# Patient Record
Sex: Female | Born: 1985 | Race: White | Hispanic: No | Marital: Single
Health system: Southern US, Community
[De-identification: ages and names within clinical notes are randomized; demographics above are authoritative.]

## PROBLEM LIST (undated history)

## (undated) DIAGNOSIS — G43909 Migraine, unspecified, not intractable, without status migrainosus: Secondary | ICD-10-CM

## (undated) DIAGNOSIS — G7111 Myotonic muscular dystrophy: Secondary | ICD-10-CM

## (undated) DIAGNOSIS — J45909 Unspecified asthma, uncomplicated: Secondary | ICD-10-CM

## (undated) DIAGNOSIS — F419 Anxiety disorder, unspecified: Secondary | ICD-10-CM

## (undated) HISTORY — DX: Migraine, unspecified, not intractable, without status migrainosus: G43.909

## (undated) HISTORY — PX: INNER EAR SURGERY: SHX679

## (undated) HISTORY — PX: TUBAL LIGATION: SHX77

## (undated) HISTORY — PX: OTHER SURGICAL HISTORY: SHX169

## (undated) HISTORY — DX: Anxiety disorder, unspecified: F41.9

## (undated) HISTORY — PX: CHOLECYSTECTOMY: SHX55

---

## 2019-05-23 ENCOUNTER — Ambulatory Visit: Payer: Medicaid Other | Admitting: Allergy and Immunology

## 2019-05-24 ENCOUNTER — Other Ambulatory Visit: Payer: Self-pay

## 2019-05-24 ENCOUNTER — Emergency Department (HOSPITAL_COMMUNITY): Payer: Medicaid Other

## 2019-05-24 ENCOUNTER — Emergency Department (HOSPITAL_COMMUNITY)
Admission: EM | Admit: 2019-05-24 | Discharge: 2019-05-24 | Disposition: A | Discharge status: Home / Self Care | Payer: Medicaid Other | Attending: Emergency Medicine | Admitting: Emergency Medicine

## 2019-05-24 DIAGNOSIS — Z5321 Procedure and treatment not carried out due to patient leaving prior to being seen by health care provider: Secondary | ICD-10-CM | POA: Diagnosis not present

## 2019-05-24 DIAGNOSIS — M79671 Pain in right foot: Secondary | ICD-10-CM | POA: Diagnosis present

## 2019-05-24 NOTE — ED Notes (Signed)
No Answer for Room 1X

## 2019-05-24 NOTE — ED Triage Notes (Signed)
Pt endorses tripping accidentally and falling down 2 stairs injuring her distal right foot. Pedal pulse present. VSS

## 2019-06-20 ENCOUNTER — Ambulatory Visit: Payer: Medicaid Other | Admitting: Allergy and Immunology

## 2019-06-22 ENCOUNTER — Encounter (HOSPITAL_COMMUNITY): Payer: Self-pay | Admitting: Emergency Medicine

## 2019-06-22 ENCOUNTER — Emergency Department (HOSPITAL_COMMUNITY): Payer: Medicaid Other

## 2019-06-22 ENCOUNTER — Other Ambulatory Visit: Payer: Self-pay

## 2019-06-22 ENCOUNTER — Emergency Department (HOSPITAL_COMMUNITY)
Admission: EM | Admit: 2019-06-22 | Discharge: 2019-06-22 | Disposition: A | Discharge status: Home / Self Care | Payer: Medicaid Other | Attending: Emergency Medicine | Admitting: Emergency Medicine

## 2019-06-22 DIAGNOSIS — F410 Panic disorder [episodic paroxysmal anxiety] without agoraphobia: Secondary | ICD-10-CM | POA: Diagnosis not present

## 2019-06-22 DIAGNOSIS — R42 Dizziness and giddiness: Secondary | ICD-10-CM | POA: Insufficient documentation

## 2019-06-22 DIAGNOSIS — R0602 Shortness of breath: Secondary | ICD-10-CM | POA: Insufficient documentation

## 2019-06-22 DIAGNOSIS — Z8709 Personal history of other diseases of the respiratory system: Secondary | ICD-10-CM | POA: Insufficient documentation

## 2019-06-22 DIAGNOSIS — G7100 Muscular dystrophy, unspecified: Secondary | ICD-10-CM | POA: Diagnosis not present

## 2019-06-22 DIAGNOSIS — R519 Headache, unspecified: Secondary | ICD-10-CM | POA: Diagnosis not present

## 2019-06-22 DIAGNOSIS — R0789 Other chest pain: Secondary | ICD-10-CM | POA: Diagnosis present

## 2019-06-22 HISTORY — DX: Unspecified asthma, uncomplicated: J45.909

## 2019-06-22 HISTORY — DX: Myotonic muscular dystrophy: G71.11

## 2019-06-22 LAB — CBC
HCT: 43.8 % (ref 36.0–46.0)
Hemoglobin: 13.8 g/dL (ref 12.0–15.0)
MCH: 30.7 pg (ref 26.0–34.0)
MCHC: 31.5 g/dL (ref 30.0–36.0)
MCV: 97.6 fL (ref 80.0–100.0)
Platelets: 283 10*3/uL (ref 150–400)
RBC: 4.49 MIL/uL (ref 3.87–5.11)
RDW: 12.5 % (ref 11.5–15.5)
WBC: 7 10*3/uL (ref 4.0–10.5)
nRBC: 0 % (ref 0.0–0.2)

## 2019-06-22 LAB — BASIC METABOLIC PANEL
Anion gap: 12 (ref 5–15)
BUN: 5 mg/dL — ABNORMAL LOW (ref 6–20)
CO2: 18 mmol/L — ABNORMAL LOW (ref 22–32)
Calcium: 9.3 mg/dL (ref 8.9–10.3)
Chloride: 109 mmol/L (ref 98–111)
Creatinine, Ser: 0.77 mg/dL (ref 0.44–1.00)
GFR calc Af Amer: >60 mL/min (ref >60)
GFR calc non Af Amer: >60 mL/min (ref >60)
Glucose, Bld: 115 mg/dL — ABNORMAL HIGH (ref 70–99)
Potassium: 3.4 mmol/L — ABNORMAL LOW (ref 3.5–5.1)
Sodium: 139 mmol/L (ref 135–145)

## 2019-06-22 LAB — I-STAT BETA HCG BLOOD, ED (MC, WL, AP ONLY): I-stat hCG, quantitative: <5 m[IU]/mL (ref <5)

## 2019-06-22 LAB — TROPONIN I (HIGH SENSITIVITY): Troponin I (High Sensitivity): 4 ng/L (ref <18)

## 2019-06-22 MED ORDER — SODIUM CHLORIDE 0.9% FLUSH: 3.0000 mL | Freq: Once | INTRAVENOUS | Status: DC

## 2019-06-22 NOTE — Discharge Instructions (Signed)
You were seen today for chest pain. Your labs, imaging, and EKG did not show evidence that this pain is coming from your heart. You also did not show evidence of infection or blood clot. You should follow up with your primary doctor soon to discuss your anxiety further and potentially start medications or see a counselor. If you develop worsened shortness of breath, chest pain, or nausea, vomiting, or fever, come back to the ED to be seen.

## 2019-06-22 NOTE — ED Triage Notes (Addendum)
C/o pain to center of chest, SOB, and tingling to bilateral hands that started approx 30 min ago.  Pt hyperventilating on arrival.  States she was upset about something prior to arrival.  Encouraged pt to slow breathing down.

## 2019-06-22 NOTE — ED Notes (Signed)
Patient verbalizes understanding of discharge instructions. Opportunity for questioning and answers were provided. Armband removed by staff, pt discharged from ED.  

## 2019-06-22 NOTE — ED Provider Notes (Signed)
[facility] EMERGENCY DEPARTMENT Provider Note   CSN: [id number] Arrival date & time: 06/22/19  1824     History   Chief Complaint Chief Complaint  Patient presents with  . Chest Pain  . Shaking    HPI Leslie Mcintyre is a 33 y.o. 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 Pain is described as sharp, shooting and radiates to her shoulder. She has some pain with deep breathing. She has not taken any medication for this. She feels she has been more short of breath today but has not used her inhaler. States nothing makes pain better or worse.  States she has been under a lot of stress recently as she had an aunt and uncle recently pass away as well as her dog and cat. She had a similar episode last year and was told it was anxiety related. She is not on any medications for anxiety nor does she see a Social worker. She is seen in a Muscular Dystrophy clinic where she gets EKGs every 2 years but has not been seen there in about 4 years. Takes OCPs. She is a never smoker. No FH of early cardiac disease. Denies N/V, fever, cough, congestion, leg swelling.     Past Medical History:  Diagnosis Date  . Asthma   . 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   Past Surgical History:  Procedure Laterality Date  . CHOLECYSTECTOMY    . INNER EAR SURGERY    . TUBAL LIGATION       OB History   No obstetric history on file.      Home Medications    Prior to Admission medications   Not on File    Family History No family history on file.  Social History Social History   Tobacco Use  . Smoking status: Never Smoker  . Smokeless tobacco: Never Used  Substance Use Topics   . Alcohol use: Not Currently  . Drug use: Never     Allergies   Patient has no allergy information on record.   Review of Systems Review of Systems  Constitutional: Negative for fever.  HENT: Negative for congestion, rhinorrhea and sore throat.   Respiratory: Positive for shortness of breath. Negative for cough.   Cardiovascular: Positive for chest pain. Negative for leg swelling.  Gastrointestinal: Negative for nausea and vomiting.  Genitourinary: Negative for difficulty urinating.  Skin: Negative for rash.  Neurological: Positive for dizziness and headaches.    Physical Exam Updated Vital Signs BP 96/69   Pulse 75   Temp 99.1 F (37.3 C) (Oral)   Resp (!) 22   SpO2 99%   Physical Exam Constitutional:      General: She is not in acute distress.    Appearance: She is well-developed. She is not ill-appearing, toxic-appearing or diaphoretic.  HENT:     Head: Normocephalic.  Cardiovascular:     Rate and Rhythm: Normal rate and regular rhythm.     Heart sounds: Normal heart sounds. No murmur.  Pulmonary:     Effort: Pulmonary effort is normal. No tachypnea, accessory muscle usage or respiratory distress.     Breath sounds: Normal breath sounds. No wheezing or rales.  Chest:  Chest wall: Tenderness present.  Abdominal:     General: Bowel sounds are normal.     Palpations: Abdomen is soft.     Tenderness: There is no abdominal tenderness.  Musculoskeletal:     Right lower leg: She exhibits no tenderness. No edema.     Left lower leg: She exhibits no tenderness. No edema.  Skin:    General: Skin is warm and dry.     Findings: No erythema.  Neurological:     Mental Status: She is alert.  Psychiatric:        Mood and Affect: Mood is anxious.     ED Treatments / Results  Labs (all labs ordered are listed, but only abnormal results are displayed) Labs Reviewed  BASIC METABOLIC PANEL - Abnormal; Notable for the following components:      Result Value    Potassium 3.4 (*)    CO2 18 (*)    Glucose, Bld 115 (*)    BUN 5 (*)    All other components within normal limits  CBC  I-STAT BETA HCG BLOOD, ED ([facility], [facility], [facility] ONLY)  TROPONIN I (HIGH SENSITIVITY)  TROPONIN I (HIGH SENSITIVITY)    EKG EKG Interpretation  Date/Time:  Saturday June 22 2019 18:38:43 EST Ventricular Rate:  101 PR Interval:  138 QRS Duration: 70 QT Interval:  346 QTC Calculation: 448 R Axis:   74 Text Interpretation: Sinus tachycardia Nonspecific T wave abnormality Abnormal ECG Poor baseline Confirmed by Blane OharaZavitz, Joshua [phone]([id number]) on 06/22/2019 8:08:38 PM   Radiology Dg Chest 2 View  Result Date: 06/22/2019 CLINICAL DATA:  C/o pain to center of chest, SOB, and tingling to bilateral hands that started approx 30 min ago. Pt hyperventilating on arrival. States she was upset about something prior to arrival. Encouraged pt to slow breathing down. EXAM: CHEST - 2 VIEW COMPARISON:  05/11/2018. FINDINGS: The heart size and mediastinal contours are within normal limits. Both lungs are clear. No pleural effusion or pneumothorax. The visualized skeletal structures are unremarkable. IMPRESSION: No active cardiopulmonary disease. Electronically Signed   By: Amie Portlandavid  Ormond M.D.   On: 06/22/2019 20:17    Procedures Procedures (including critical care time)  Medications Ordered in ED Medications  sodium chloride flush (NS) 0.9 % injection 3 mL (has no administration in time range)    Initial Impression / Assessment and Plan / ED Course  I have reviewed the triage vital signs and the nursing notes.  Pertinent labs & imaging results that were available during my care of the patient were reviewed by me and considered in my medical decision making (see chart for details).   33yo F w/ h/o asthma, myotonic muscular dystrophy who presents with chest pain and tingling of extremities immediately after getting into verbal argument with boyfriend earlier today while visiting father in the  hospital. Vitals notable for tachycardia and tachypnea initially but normalized by the time of my exam. Appropriately saturated on room air without increased work of breathing. On exam, cardiac and lung exam wnl and without wheezing or rales. CXR clear with no evidence of pneumonia. Electrolytes wnl. Chest pain reproducible with palpation of chest wall. Considered PE given OCP use however has not had LE swelling, redness, pain or any recent long distance travel or immobility. Also with HR and RR wnl on exam, low suspicion for PE. Initial EKG with wandering baseline, repeat EKG without ischemic changes or interval or T wave abnormalities. Troponin negative. Given several recent stressors and normal workup, most likely  2/2 anxiety, especially given similar episode in the past. Recommended patient follow up with PCP and/or counselor. Instructed patient to return to ED for worsened chest pain, shortness of breath or develops N/V, fevers. Patient verbalized understanding.    Final Clinical Impressions(s) / ED Diagnoses   Final diagnoses:  Panic attack    ED Discharge Orders    None       Ellwood Dense, DO 06/22/19 2049    Blane Ohara, MD 06/22/19 [phone]

## 2019-07-11 ENCOUNTER — Other Ambulatory Visit: Payer: Self-pay

## 2019-07-11 ENCOUNTER — Ambulatory Visit (INDEPENDENT_AMBULATORY_CARE_PROVIDER_SITE_OTHER): Payer: Medicaid Other | Admitting: Allergy and Immunology

## 2019-07-11 ENCOUNTER — Encounter: Payer: Self-pay | Admitting: Allergy and Immunology

## 2019-07-11 VITALS — BP 104/60 | HR 99 | Temp 98.5°F | Resp 16 | Ht 63.0 in | Wt 110.9 lb

## 2019-07-11 DIAGNOSIS — J3089 Other allergic rhinitis: Secondary | ICD-10-CM | POA: Diagnosis not present

## 2019-07-11 DIAGNOSIS — H1013 Acute atopic conjunctivitis, bilateral: Secondary | ICD-10-CM

## 2019-07-11 DIAGNOSIS — H1010 Acute atopic conjunctivitis, unspecified eye: Secondary | ICD-10-CM | POA: Insufficient documentation

## 2019-07-11 DIAGNOSIS — J4540 Moderate persistent asthma, uncomplicated: Secondary | ICD-10-CM | POA: Insufficient documentation

## 2019-07-11 DIAGNOSIS — J329 Chronic sinusitis, unspecified: Secondary | ICD-10-CM | POA: Diagnosis not present

## 2019-07-11 MED ORDER — AZELASTINE & FLUTICASONE 137 & 50 MCG/ACT NA THPK: 1.0000 | PACK | Freq: Two times a day (BID) | NASAL | 5 refills | Status: AC

## 2019-07-11 MED ORDER — PAZEO 0.7 % OP SOLN: 1.0000 [drp] | OPHTHALMIC | 5 refills | Status: AC

## 2019-07-11 MED ORDER — BUDESONIDE-FORMOTEROL FUMARATE 160-4.5 MCG/ACT IN AERO: INHALATION_SPRAY | RESPIRATORY_TRACT | 5 refills | Status: AC

## 2019-07-11 MED ORDER — ALBUTEROL SULFATE HFA 108 (90 BASE) MCG/ACT IN AERS: 2.0000 | INHALATION_SPRAY | RESPIRATORY_TRACT | 1 refills | Status: AC | PRN

## 2019-07-11 NOTE — Progress Notes (Signed)
New Patient Note  RE: Leslie Mcintyre Prevo MRN: [id number] DOB: 09-10-1985 Date of Office Visit: 07/11/2019  Referring provider: Earnest RosierMurphy, John B, MD Primary care provider: Earnest RosierMurphy, John B, MD  Chief Complaint: Asthma, Sinusitis, and Sore Throat   History of present illness: Leslie Mcintyre Vey is a 33 y.o. female seen today in consultation requested by Theophilus KindsJohn Murphy, MD.  She reports that over the past year and a half she has had recurrent infections.  She states that she has been on multiple rounds of antibiotics and systemic steroids for pharyngitis.  In addition, she reports that she has had 7 or 8 sinus infections over this past year as well as 4 episodes of bilateral otitis media.  She notes that she has not had ear infections prior to this past year and she has had more frequent sinus infections over this past year as well.  She has had recurrent urinary tract infection since she was 33 years old.  She has no known history of pneumonia. She experiences nasal congestion, rhinorrhea, postnasal drainage, and sinus pressure.  No significant seasonal symptom variation has been noted nor have specific environmental triggers been identified.  She currently takes montelukast daily and uses fluticasone nasal spray at the onset of sinus infections.  She is able to smell and taste. Griffin has had asthma symptoms since early childhood.  She reports that her asthma symptoms have become more frequent this year she is currently using Symbicort 160-4.5 g as needed.  She does not use a spacer device with her HFA inhalers.  Assessment and plan: Recurrent infections Immunocompetence will be assessed with labs.  The following labs have been ordered: CBC with differential, IgG, IgA and IgM, tetanus IgG titers, and pneumococcal IgG titers.  If pre-vaccination titers are low, post-vaccination titers will be drawn to assess response.    The patient will be called with further recommendations and follow-up instructions  once the labs have returned.  Perennial allergic rhinitis with a probable nonallergic component  Aeroallergen avoidance measures have been discussed and provided in written form.  A prescription has been provided for azelastine/fluticasone nasal spray, 1 spray per nostril twice daily as needed. Proper nasal spray technique has been discussed and demonstrated.  Nasal saline lavage (NeilMed) has been recommended as needed and prior to medicated nasal sprays along with instructions for proper administration.  For thick post nasal drainage, add guaifenesin 1200 mg (Mucinex Maximum Strength)  twice daily as needed with adequate hydration as discussed.  Moderate persistent asthma  For now, continue Symbicort 160-4.5 g, 2 inhalations twice daily.  To maximize pulmonary deposition, a spacer has been provided along with instructions for its proper administration with an HFA inhaler.  Continue albuterol HFA, 1 to 2 inhalations every 4-6 hours if needed.  Subjective and objective measures of pulmonary function will be followed and the treatment plan will be adjusted accordingly.  Allergic conjunctivitis  Treatment plan as outlined above for allergic rhinitis.  A prescription has been provided for Pazeo, one drop per eye daily as needed.  I have also recommended eye lubricant drops (i.e., Natural Tears) as needed.   Meds ordered this encounter  Medications  . Olopatadine HCl (PAZEO) 0.7 % SOLN    Sig: Place 1 drop into both eyes 1 day or 1 dose.    Dispense:  2.5 mL    Refill:  5  . budesonide-formoterol (SYMBICORT) 160-4.5 MCG/ACT inhaler    Sig: 2 puffs twice daily to prevent coughing or wheezing.  Dispense:  1 Inhaler    Refill:  5  . albuterol (PROAIR HFA) 108 (90 Base) MCG/ACT inhaler    Sig: Inhale 2 puffs into the lungs every 4 (four) hours as needed for wheezing or shortness of breath.    Dispense:  18 g    Refill:  1  . Azelastine & Fluticasone 137 & 50 MCG/ACT THPK     Sig: Place 1 spray into the nose 2 (two) times daily.    Dispense:  1 each    Refill:  5    Diagnostics: Spirometry: Spirometry reveals an FVC of 2.84 L and an FEV1 of 2.47 L (80% predicted) without postbronchodilator improvement.  Please see scanned spirometry results for details. Epicutaneous testing: Negative despite a positive histamine control. Intradermal testing: Positive to molds.  Physical examination: Blood pressure 104/60, pulse 99, temperature 98.5 F (36.9 C), temperature source Oral, resp. rate 16, height 5\' 3"  (1.6 m), weight 110 lb 14.3 oz (50.3 kg), SpO2 98 %.  General: Alert, interactive, in no acute distress. HEENT: TMs pearly gray, turbinates moderately edematous with clear discharge, post-pharynx moderately erythematous. Neck: Supple without lymphadenopathy. Lungs: Clear to auscultation without wheezing, rhonchi or rales. CV: Normal S1, S2 without murmurs. Abdomen: Nondistended, nontender. Skin: Warm and dry, without lesions or rashes. Extremities:  No clubbing, cyanosis or edema. Neuro:   Grossly intact.  Review of systems:  Review of systems negative except as noted in HPI / PMHx or noted below: Review of Systems  Constitutional: Negative.   HENT: Negative.   Eyes: Negative.   Respiratory: Negative.   Cardiovascular: Negative.   Gastrointestinal: Negative.   Genitourinary: Negative.   Musculoskeletal: Negative.   Skin: Negative.   Neurological: Negative.   Endo/Heme/Allergies: Negative.   Psychiatric/Behavioral: Negative.     Past medical history:  Past Medical History:  Diagnosis Date  . Anxiety   . Asthma   . Migraines   . Myotonic muscular dystrophy ([facility])     Past surgical history:  Past Surgical History:  Procedure Laterality Date  . balifiritis Bilateral    get the bumps removed  . CHOLECYSTECTOMY    . INNER EAR SURGERY    . TUBAL LIGATION      Family history: Family History  Problem Relation Age of Onset  . Allergic rhinitis  Mother   . Angioedema Neg Hx   . Asthma Neg Hx   . Eczema Neg Hx   . Immunodeficiency Neg Hx   . Urticaria Neg Hx     Social history: Social History   Socioeconomic History  . Marital status: Single    Spouse name: Not on file  . Number of children: Not on file  . Years of education: Not on file  . Highest education level: Not on file  Occupational History  . Not on file  Tobacco Use  . Smoking status: Passive Smoke Exposure - Never Smoker  . Smokeless tobacco: Never Used  Substance and Sexual Activity  . Alcohol use: Not Currently  . Drug use: Never  . Sexual activity: Not on file  Other Topics Concern  . Not on file  Social History Narrative  . Not on file   Social Determinants of Health   Financial Resource Strain:   . Difficulty of Paying Living Expenses: Not on file  Food Insecurity:   . Worried About in the Last Year: Not on file  . Ran Out of Food in the Last Year: Not on file  Transportation Needs:   . Film/video editor (Medical): Not on file  . Lack of Transportation (Non-Medical): Not on file  Physical Activity:   . Days of Exercise per Week: Not on file  . Minutes of Exercise per Session: Not on file  Stress:   . Feeling of Stress : Not on file  Social Connections:   . Frequency of Communication with Friends and Family: Not on file  . Frequency of Social Gatherings with Friends and Family: Not on file  . Attends Religious Services: Not on file  . Active Member of Clubs or Organizations: Not on file  . Attends Archivist Meetings: Not on file  . Marital Status: Not on file  Intimate Partner Violence:   . Fear of Current or Ex-Partner: Not on file  . Emotionally Abused: Not on file  . Physically Abused: Not on file  . Sexually Abused: Not on file    Environmental History: The patient lives in a house with carpeting throughout, central air, and window air conditioning units.  She currently has no pets in the home.   There is no known mold/water damage in the home.  She is a non-smoker.  Current Outpatient Medications  Medication Sig Dispense Refill  . albuterol (VENTOLIN HFA) 108 (90 Base) MCG/ACT inhaler INL 1 PUFF PO Q 4 H PRN    . budesonide-formoterol (SYMBICORT) 160-4.5 MCG/ACT inhaler     . cetirizine (ZYRTEC) 10 MG tablet Take 10 mg by mouth daily.    Marland Kitchen CIPRODEX OTIC suspension Place 4 drops into both ears as needed.     . montelukast (SINGULAIR) 10 MG tablet Take 10 mg by mouth as needed.     . neomycin-polymyxin-hydrocortisone (CORTISPORIN) 3.5-10000-1 OTIC suspension Place 4 drops into both ears as needed.     Marland Kitchen omeprazole (PRILOSEC) 20 MG capsule Take 20 mg by mouth daily.    . progesterone (PROMETRIUM) 200 MG capsule     . promethazine (PHENERGAN) 25 MG tablet Take 25 mg by mouth as needed.     Marland Kitchen albuterol (PROAIR HFA) 108 (90 Base) MCG/ACT inhaler Inhale 2 puffs into the lungs every 4 (four) hours as needed for wheezing or shortness of breath. 18 g 1  . Azelastine & Fluticasone 137 & 50 MCG/ACT THPK Place 1 spray into the nose 2 (two) times daily. 1 each 5  . budesonide-formoterol (SYMBICORT) 160-4.5 MCG/ACT inhaler 2 puffs twice daily to prevent coughing or wheezing. 1 Inhaler 5  . Olopatadine HCl (PAZEO) 0.7 % SOLN Place 1 drop into both eyes 1 day or 1 dose. 2.5 mL 5   No current facility-administered medications for this visit.    Known medication allergies: Allergies  Allergen Reactions  . Lac Bovis Other (See Comments)    Other reaction(s): Other Kidney infection Kidney infection Other reaction(s): Other Kidney infection Kidney infection   . Sumatriptan Other (See Comments)    Patient can not tolerate. Patient can not tolerate.   . Tramadol Itching and Rash    Blurry vision   . Triptans Other (See Comments)    Patient can not tolerate. Patient can not tolerate.     I appreciate the opportunity to take part in Jonet's care. Please do not hesitate to contact me  with questions.  Sincerely,   R. Edgar Frisk, MD

## 2019-07-11 NOTE — Patient Instructions (Addendum)
Recurrent infections Immunocompetence will be assessed with labs.  The following labs have been ordered: CBC with differential, IgG, IgA and IgM, tetanus IgG titers, and pneumococcal IgG titers.  If pre-vaccination titers are low, post-vaccination titers will be drawn to assess response.    The patient will be called with further recommendations and follow-up instructions once the labs have returned.  Perennial allergic rhinitis with a probable nonallergic component  Aeroallergen avoidance measures have been discussed and provided in written form.  A prescription has been provided for azelastine/fluticasone nasal spray, 1 spray per nostril twice daily as needed. Proper nasal spray technique has been discussed and demonstrated.  Nasal saline lavage (NeilMed) has been recommended as needed and prior to medicated nasal sprays along with instructions for proper administration.  For thick post nasal drainage, add guaifenesin 1200 mg (Mucinex Maximum Strength)  twice daily as needed with adequate hydration as discussed.  Moderate persistent asthma  For now, continue Symbicort 160-4.5 g, 2 inhalations twice daily.  To maximize pulmonary deposition, a spacer has been provided along with instructions for its proper administration with an HFA inhaler.  Continue albuterol HFA, 1 to 2 inhalations every 4-6 hours if needed.  Subjective and objective measures of pulmonary function will be followed and the treatment plan will be adjusted accordingly.  Allergic conjunctivitis  Treatment plan as outlined above for allergic rhinitis.  A prescription has been provided for Pazeo, one drop per eye daily as needed.  I have also recommended eye lubricant drops (i.e., Natural Tears) as needed.   When lab results have returned you will be called with further recommendations. With the newly implemented Cures Act, the labs may be visible to you at the same time they become visible to [place]. However, the  results will typically not be addressed until all of the results are back, so please be patient.  Until you have heard from [place], please continue the treatment plan as outlined on your take home sheet.  Control of Mold Allergen  Mold and fungi can grow on a variety of surfaces provided certain temperature and moisture conditions exist.  Outdoor molds grow on plants, decaying vegetation and soil.  The major outdoor mold, Alternaria and Cladosporium, are found in very high numbers during hot and dry conditions.  Generally, a late Summer - Fall peak is seen for common outdoor fungal spores.  Rain will temporarily lower outdoor mold spore count, but counts rise rapidly when the rainy period ends.  The most important indoor molds are Aspergillus and Penicillium.  Dark, humid and poorly ventilated basements are ideal sites for mold growth.  The next most common sites of mold growth are the bathroom and the kitchen.  Outdoor [employer] 1. Use air conditioning and keep windows closed 2. Avoid exposure to decaying vegetation. 3. Avoid leaf raking. 4. Avoid grain handling. 5. Consider wearing a face mask if working in moldy areas.  Indoor Mold Control 1. Maintain humidity below 50%. 2. Clean washable surfaces with 5% bleach solution. 3. Remove sources e.g. Contaminated carpets.

## 2019-07-11 NOTE — Assessment & Plan Note (Signed)
   Aeroallergen avoidance measures have been discussed and provided in written form.  A prescription has been provided for azelastine/fluticasone nasal spray, 1 spray per nostril twice daily as needed. Proper nasal spray technique has been discussed and demonstrated.  Nasal saline lavage (NeilMed) has been recommended as needed and prior to medicated nasal sprays along with instructions for proper administration.  For thick post nasal drainage, add guaifenesin 1200 mg (Mucinex Maximum Strength)  twice daily as needed with adequate hydration as discussed. 

## 2019-07-11 NOTE — Assessment & Plan Note (Signed)
   Treatment plan as outlined above for allergic rhinitis.  A prescription has been provided for Pazeo, one drop per eye daily as needed.  I have also recommended eye lubricant drops (i.e., Natural Tears) as needed. 

## 2019-07-11 NOTE — Assessment & Plan Note (Signed)
   Immunocompetence will be assessed with labs.  The following labs have been ordered: CBC with differential, IgG, IgA and IgM, tetanus IgG titers, and pneumococcal IgG titers.  If pre-vaccination titers are low, post-vaccination titers will be drawn to assess response.    The patient will be called with further recommendations and follow-up instructions once the labs have returned. 

## 2019-07-11 NOTE — Assessment & Plan Note (Signed)
   For now, continue Symbicort 160-4.5 g, 2 inhalations twice daily.  To maximize pulmonary deposition, a spacer has been provided along with instructions for its proper administration with an HFA inhaler.  Continue albuterol HFA, 1 to 2 inhalations every 4-6 hours if needed.  Subjective and objective measures of pulmonary function will be followed and the treatment plan will be adjusted accordingly.

## 2019-07-16 ENCOUNTER — Other Ambulatory Visit: Payer: Self-pay

## 2019-07-16 MED ORDER — AZELASTINE HCL 0.1 % NA SOLN: 2.0000 | Freq: Two times a day (BID) | NASAL | 5 refills | Status: AC

## 2019-07-16 MED ORDER — FLUTICASONE PROPIONATE 50 MCG/ACT NA SUSP: 2.0000 | Freq: Two times a day (BID) | NASAL | 5 refills | Status: AC

## 2019-07-22 LAB — STREP PNEUMONIAE 23 SEROTYPES IGG
Pneumo Ab Type 1*: 1.5 ug/mL (ref >1.3)
Pneumo Ab Type 12 (12F)*: <0.1 ug/mL — ABNORMAL LOW (ref >1.3)
Pneumo Ab Type 14*: 5.8 ug/mL (ref >1.3)
Pneumo Ab Type 17 (17F)*: 0.1 ug/mL — ABNORMAL LOW (ref >1.3)
Pneumo Ab Type 19 (19F)*: 1.7 ug/mL (ref >1.3)
Pneumo Ab Type 2*: 0.2 ug/mL — ABNORMAL LOW (ref >1.3)
Pneumo Ab Type 20*: 1.2 ug/mL — ABNORMAL LOW (ref >1.3)
Pneumo Ab Type 22 (22F)*: <0.1 ug/mL — ABNORMAL LOW (ref >1.3)
Pneumo Ab Type 23 (23F)*: 2 ug/mL (ref >1.3)
Pneumo Ab Type 26 (6B)*: 1 ug/mL — ABNORMAL LOW (ref >1.3)
Pneumo Ab Type 3*: 1.8 ug/mL (ref >1.3)
Pneumo Ab Type 34 (10A)*: 0.5 ug/mL — ABNORMAL LOW (ref >1.3)
Pneumo Ab Type 4*: <0.1 ug/mL — ABNORMAL LOW (ref >1.3)
Pneumo Ab Type 43 (11A)*: 0.8 ug/mL — ABNORMAL LOW (ref >1.3)
Pneumo Ab Type 5*: 0.2 ug/mL — ABNORMAL LOW (ref >1.3)
Pneumo Ab Type 51 (7F)*: 0.3 ug/mL — ABNORMAL LOW (ref >1.3)
Pneumo Ab Type 54 (15B)*: 4.2 ug/mL (ref >1.3)
Pneumo Ab Type 56 (18C)*: <0.1 ug/mL — ABNORMAL LOW (ref >1.3)
Pneumo Ab Type 57 (19A)*: 2.6 ug/mL (ref >1.3)
Pneumo Ab Type 68 (9V)*: 0.2 ug/mL — ABNORMAL LOW (ref >1.3)
Pneumo Ab Type 70 (33F)*: 0.2 ug/mL — ABNORMAL LOW (ref >1.3)
Pneumo Ab Type 8*: 0.4 ug/mL — ABNORMAL LOW (ref >1.3)
Pneumo Ab Type 9 (9N)*: 0.2 ug/mL — ABNORMAL LOW (ref >1.3)

## 2019-07-22 LAB — CBC WITH DIFFERENTIAL/PLATELET
Basophils Absolute: 0 10*3/uL (ref 0.0–0.2)
Basos: 1 %
EOS (ABSOLUTE): 0.1 10*3/uL (ref 0.0–0.4)
Eos: 2 %
Hematocrit: 42.7 % (ref 34.0–46.6)
Hemoglobin: 14.1 g/dL (ref 11.1–15.9)
Immature Grans (Abs): 0 10*3/uL (ref 0.0–0.1)
Immature Granulocytes: 0 %
Lymphocytes Absolute: 1.6 10*3/uL (ref 0.7–3.1)
Lymphs: 24 %
MCH: 30.9 pg (ref 26.6–33.0)
MCHC: 33 g/dL (ref 31.5–35.7)
MCV: 93 fL (ref 79–97)
Monocytes Absolute: 0.7 10*3/uL (ref 0.1–0.9)
Monocytes: 11 %
Neutrophils Absolute: 4 10*3/uL (ref 1.4–7.0)
Neutrophils: 62 %
Platelets: 243 10*3/uL (ref 150–450)
RBC: 4.57 x10E6/uL (ref 3.77–5.28)
RDW: 11.9 % (ref 11.7–15.4)
WBC: 6.4 10*3/uL (ref 3.4–10.8)

## 2019-07-22 LAB — IGG, IGA, IGM
IgA/Immunoglobulin A, Serum: 342 mg/dL (ref 87–352)
IgG (Immunoglobin G), Serum: 676 mg/dL (ref 586–1602)
IgM (Immunoglobulin M), Srm: 133 mg/dL (ref 26–217)

## 2019-07-22 LAB — TETANUS ANTIBODY, IGG: Tetanus Ab, IgG: 0.39 IU/mL (ref <0.10)

## 2019-07-23 ENCOUNTER — Telehealth: Payer: Self-pay | Admitting: *Deleted

## 2019-07-23 NOTE — Telephone Encounter (Signed)
Error

## 2021-05-25 IMAGING — CR DG CHEST 2V
2 series · 2 of 2 positions shown · non-contrast
Comparison: 05/11/2018.

CLINICAL DATA: C/o pain to center of chest, SOB, and tingling to
bilateral hands that started approx 30 min ago. Pt hyperventilating
on arrival. States she was upset about something prior to arrival.
Encouraged pt to slow breathing down.

EXAM:
CHEST - 2 VIEW

[chest pa]
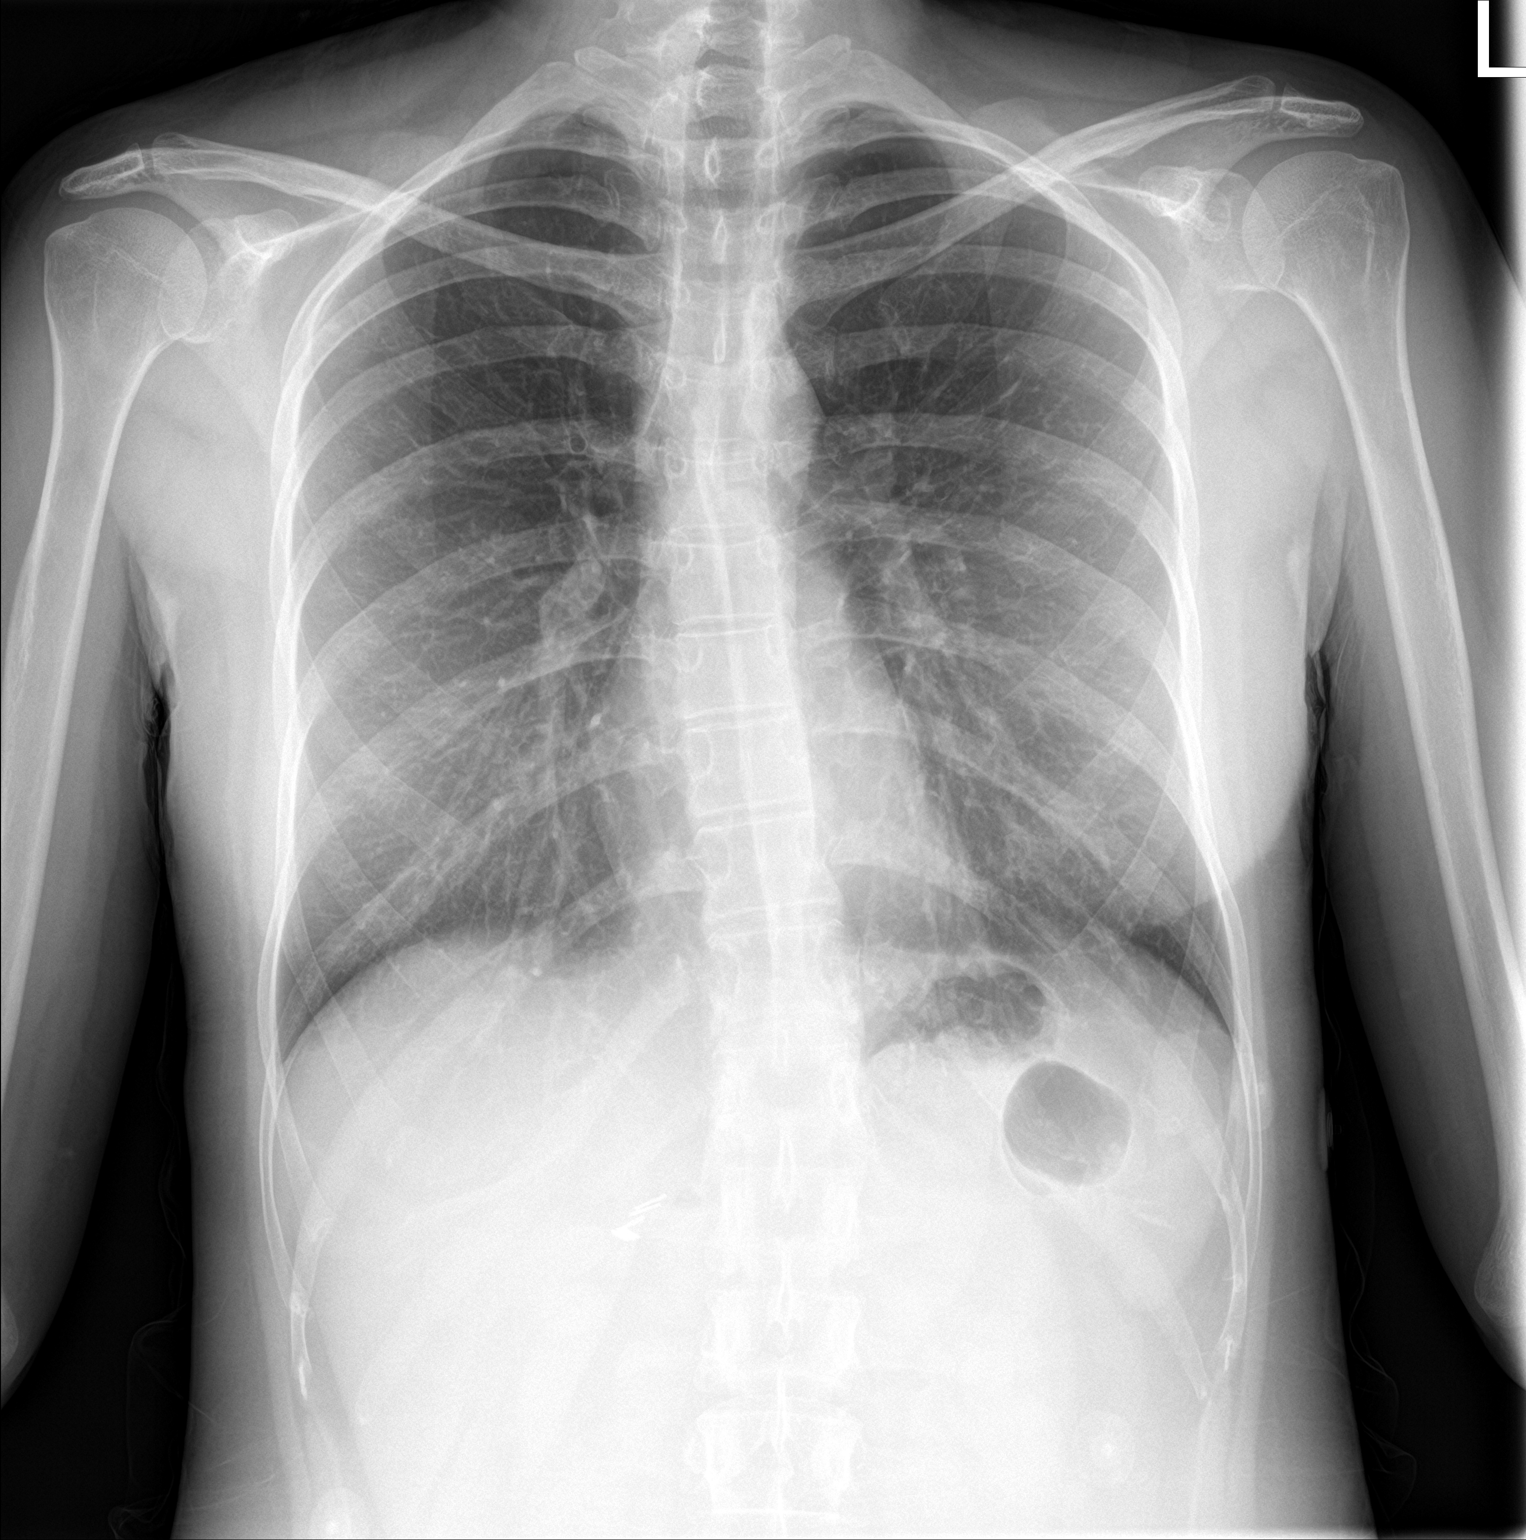

[chest lat]
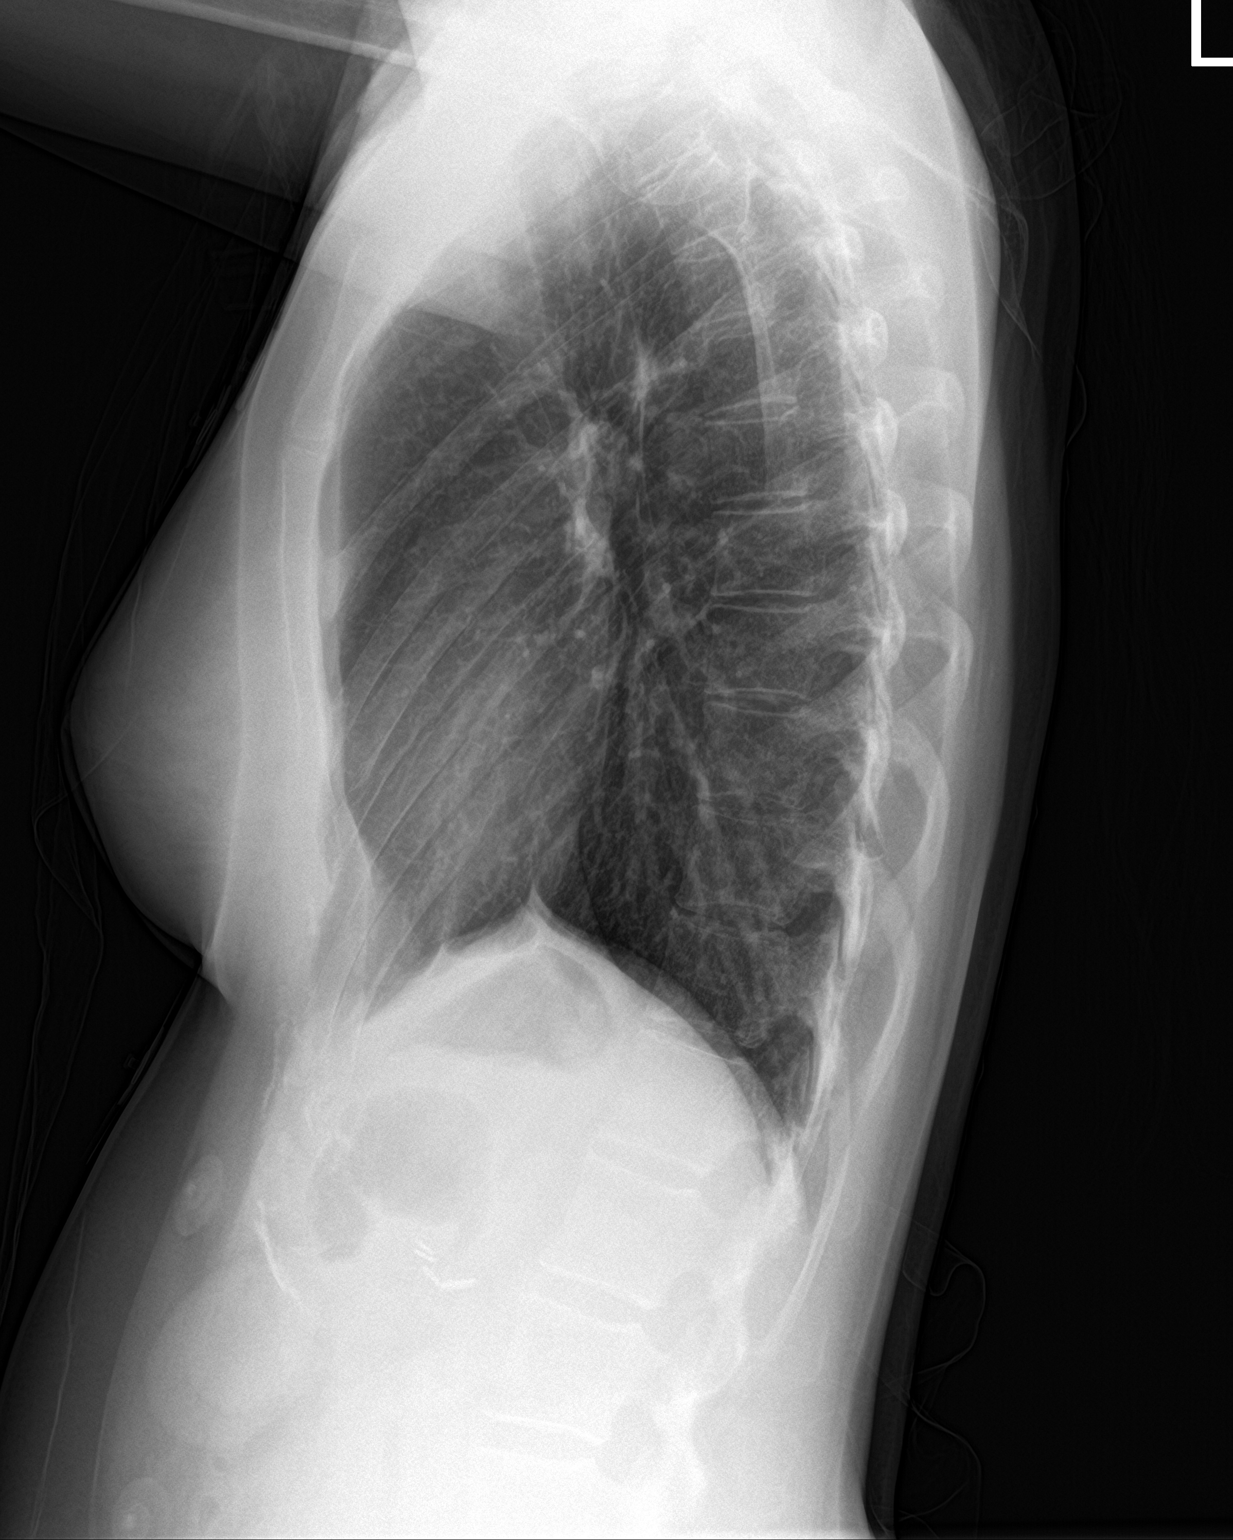

[2 of 2 positions shown; findings below may reference images not displayed]

FINDINGS: The heart size and mediastinal contours are within normal limits.
Both lungs are clear. No pleural effusion or pneumothorax. The
visualized skeletal structures are unremarkable.
IMPRESSION: No active cardiopulmonary disease.

## 2024-06-03 ENCOUNTER — Other Ambulatory Visit (HOSPITAL_COMMUNITY): Payer: Self-pay
